# Patient Record
Sex: Male | Born: 1940 | Race: White | Hispanic: No | Marital: Married
Health system: Southern US, Community
[De-identification: ages and names within clinical notes are randomized; demographics above are authoritative.]

---

## 2008-10-24 ENCOUNTER — Inpatient Hospital Stay (HOSPITAL_COMMUNITY): Admission: EM | Admit: 2008-10-24 | Discharge: 2008-11-04 | Payer: Self-pay | Admitting: Emergency Medicine

## 2008-10-25 ENCOUNTER — Ambulatory Visit: Payer: Self-pay | Admitting: Critical Care Medicine

## 2008-10-30 ENCOUNTER — Ambulatory Visit: Payer: Self-pay | Admitting: Physical Medicine & Rehabilitation

## 2008-11-04 ENCOUNTER — Ambulatory Visit: Payer: Self-pay | Admitting: Physical Medicine & Rehabilitation

## 2008-11-04 ENCOUNTER — Inpatient Hospital Stay (HOSPITAL_COMMUNITY): Admission: RE | Admit: 2008-11-04 | Discharge: 2008-11-12 | Payer: Self-pay | Admitting: Neurosurgery

## 2009-02-04 ENCOUNTER — Encounter: Admission: RE | Admit: 2009-02-04 | Discharge: 2009-02-04 | Payer: Self-pay | Admitting: Orthopedic Surgery

## 2009-09-14 IMAGING — CR DG ABDOMEN 1V
2 series · 2 of 2 positions shown · non-contrast
Comparison: Plain films abdomen 11/01/2008 and 10/30/2008.

CLINICAL DATA: Distended abdomen.

ABDOMEN - 1 VIEW

[t abdomen supine (1 of 2)]
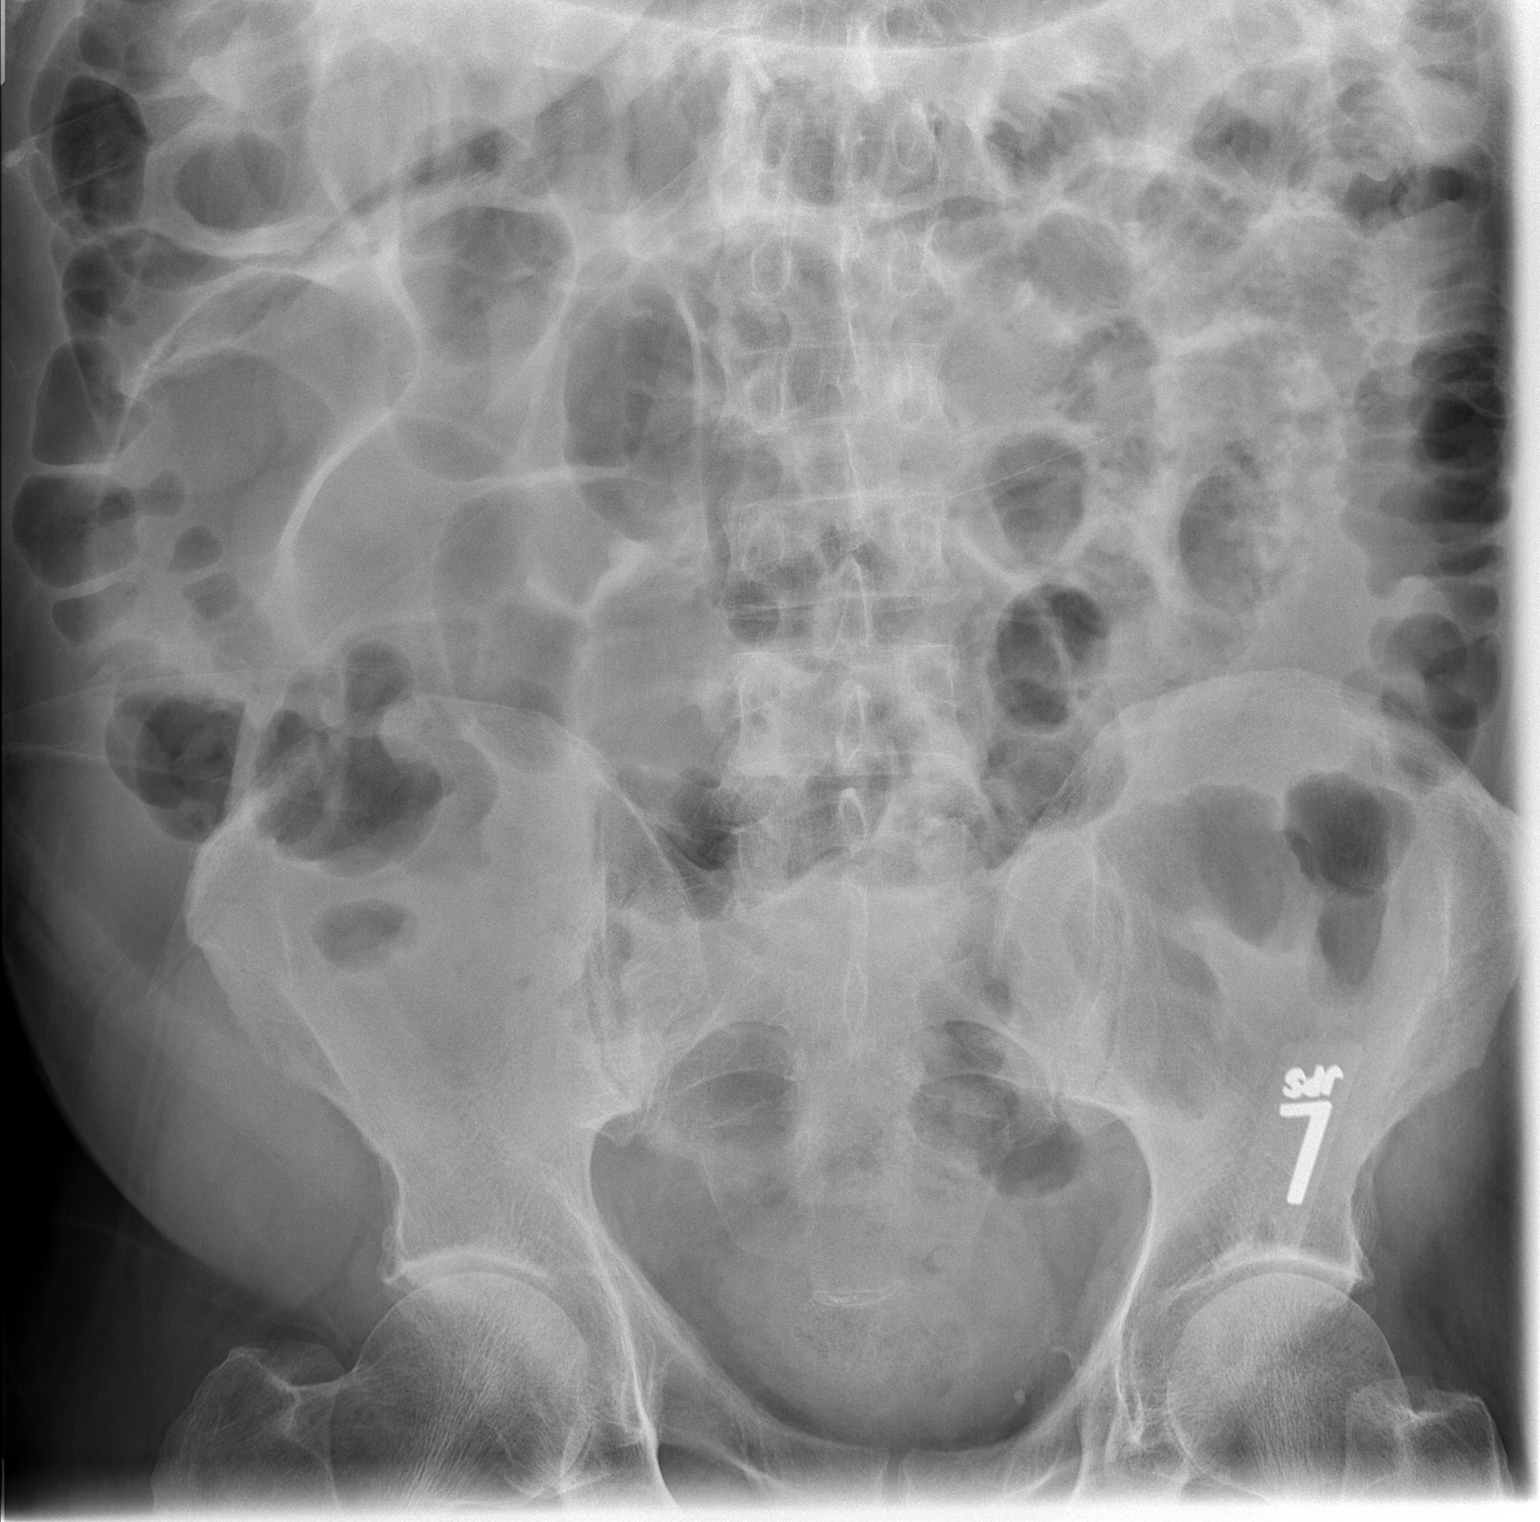

[t abdomen supine (2 of 2)]
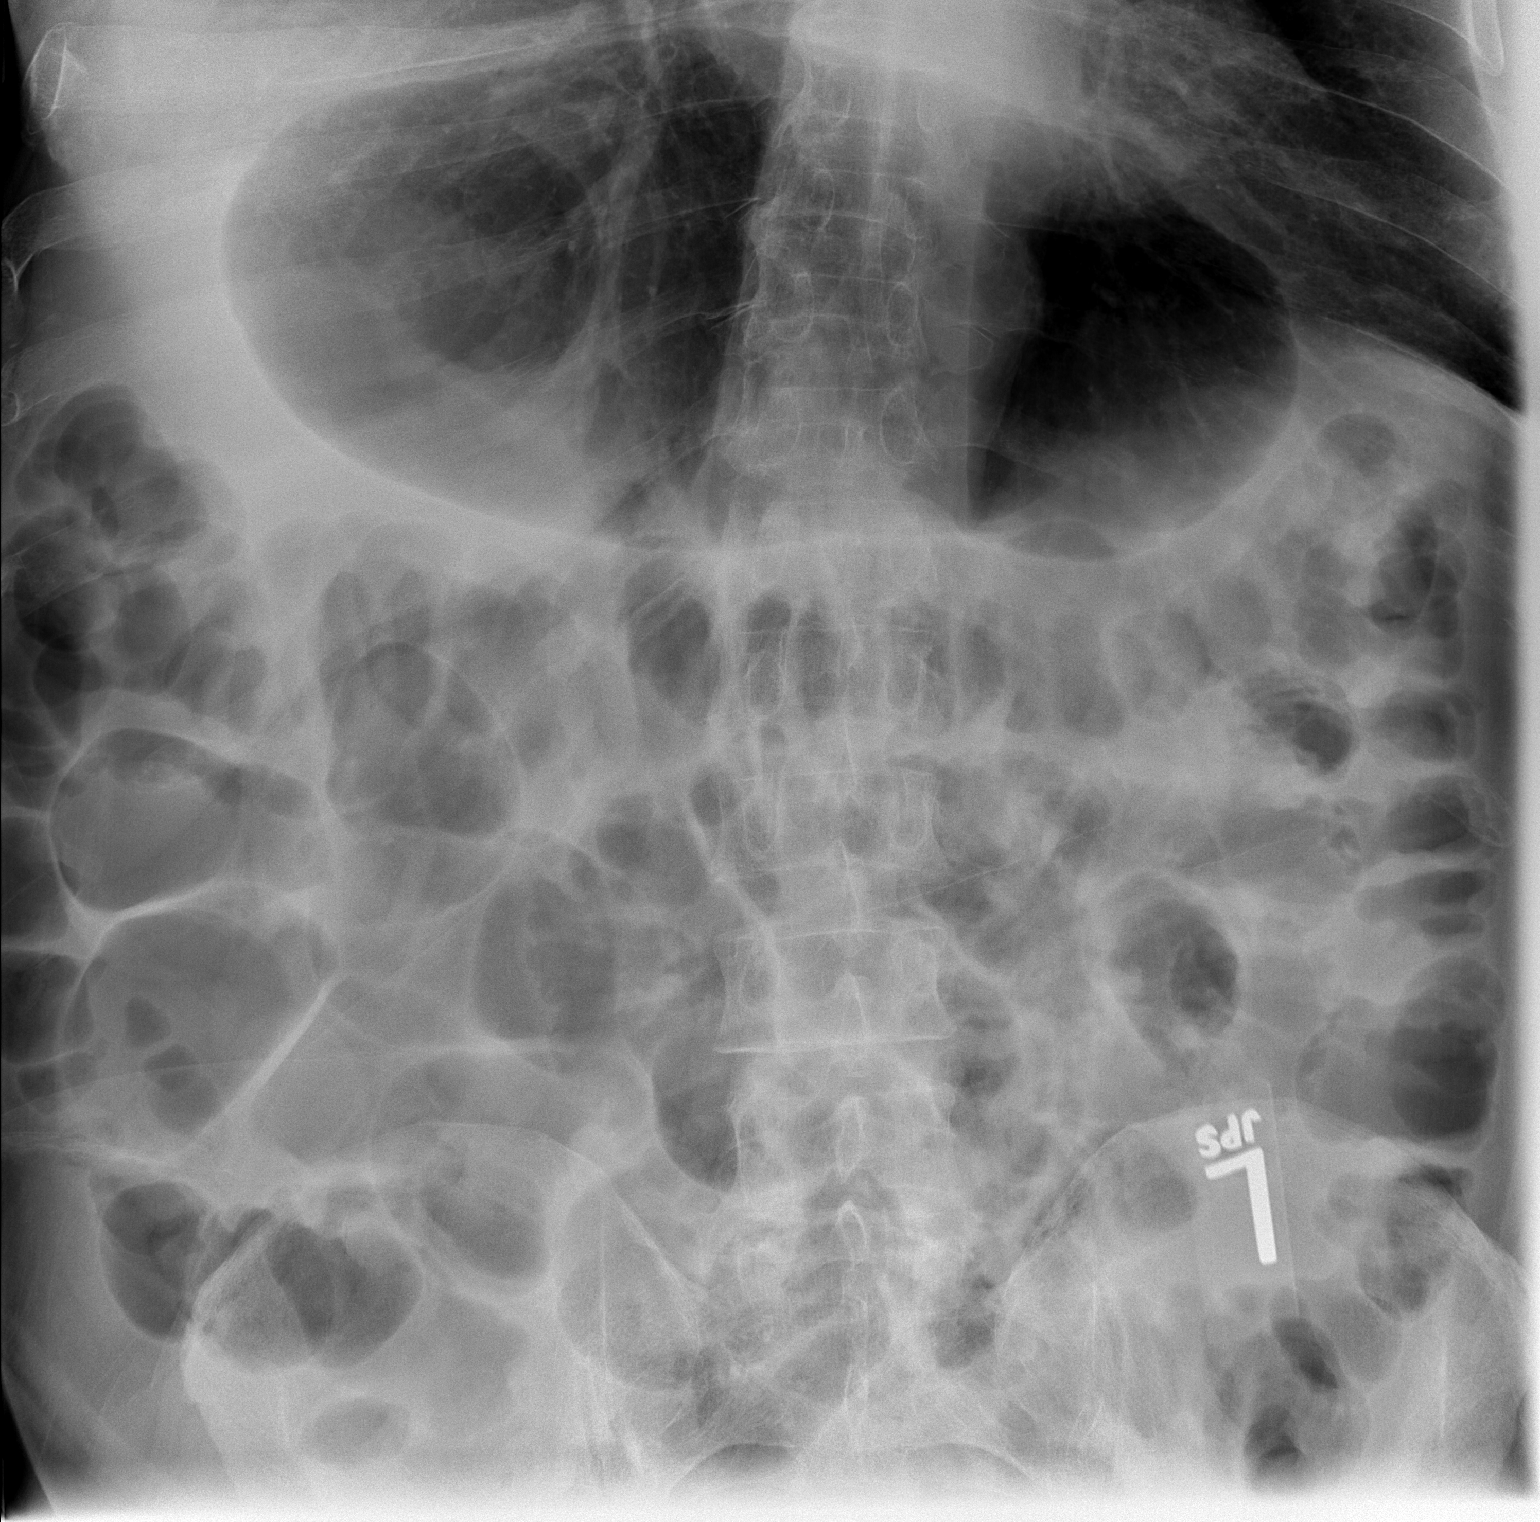

[2 of 2 positions shown; findings below may reference images not displayed]

FINDINGS: Gas filled loops of small and large bowel are seen
diffusely about the abdomen.  The stomach is distended with gas.
No evidence of free air is seen on single view.
IMPRESSION: Unchanged bowel gas pattern most compatible with ileus.

## 2010-08-23 LAB — BASIC METABOLIC PANEL
BUN: 11 mg/dL (ref 6–23)
BUN: 12 mg/dL (ref 6–23)
BUN: 16 mg/dL (ref 6–23)
BUN: 17 mg/dL (ref 6–23)
BUN: 18 mg/dL (ref 6–23)
CO2: 23 mEq/L (ref 19–32)
CO2: 24 mEq/L (ref 19–32)
CO2: 28 mEq/L (ref 19–32)
CO2: 34 mEq/L — ABNORMAL HIGH (ref 19–32)
CO2: 25 mEq/L (ref 19–32)
Calcium: 9.1 mg/dL (ref 8.4–10.5)
Chloride: 104 mEq/L (ref 96–112)
Chloride: 105 mEq/L (ref 96–112)
Chloride: 106 mEq/L (ref 96–112)
Chloride: 96 mEq/L (ref 96–112)
Chloride: 109 mEq/L (ref 96–112)
Creatinine, Ser: 1.03 mg/dL (ref 0.4–1.5)
GFR calc Af Amer: >60 mL/min (ref >60)
GFR calc Af Amer: >60 mL/min (ref >60)
GFR calc non Af Amer: >60 mL/min (ref >60)
GFR calc non Af Amer: >60 mL/min (ref >60)
GFR calc non Af Amer: >60 mL/min (ref >60)
Glucose, Bld: 105 mg/dL — ABNORMAL HIGH (ref 70–99)
Glucose, Bld: 109 mg/dL — ABNORMAL HIGH (ref 70–99)
Glucose, Bld: 114 mg/dL — ABNORMAL HIGH (ref 70–99)
Glucose, Bld: 114 mg/dL — ABNORMAL HIGH (ref 70–99)
Glucose, Bld: 95 mg/dL (ref 70–99)
Glucose, Bld: 144 mg/dL — ABNORMAL HIGH (ref 70–99)
Potassium: 4 mEq/L (ref 3.5–5.1)
Potassium: 4.1 mEq/L (ref 3.5–5.1)
Potassium: 4.1 mEq/L (ref 3.5–5.1)
Potassium: 4.2 mEq/L (ref 3.5–5.1)
Potassium: 4.3 mEq/L (ref 3.5–5.1)
Potassium: 3.7 mEq/L (ref 3.5–5.1)
Sodium: 137 mEq/L (ref 135–145)
Sodium: 144 mEq/L (ref 135–145)

## 2010-08-23 LAB — DIFFERENTIAL
Basophils Absolute: 0.1 10*3/uL (ref 0.0–0.1)
Basophils Absolute: 0 10*3/uL (ref 0.0–0.1)
Basophils Absolute: 0 10*3/uL (ref 0.0–0.1)
Basophils Absolute: 0.1 10*3/uL (ref 0.0–0.1)
Basophils Absolute: 0.1 10*3/uL (ref 0.0–0.1)
Basophils Relative: 1 % (ref 0–1)
Basophils Relative: 0 % (ref 0–1)
Basophils Relative: 0 % (ref 0–1)
Basophils Relative: 0 % (ref 0–1)
Basophils Relative: 1 % (ref 0–1)
Eosinophils Absolute: 0.4 10*3/uL (ref 0.0–0.7)
Eosinophils Absolute: 0 10*3/uL (ref 0.0–0.7)
Eosinophils Absolute: 0.2 10*3/uL (ref 0.0–0.7)
Eosinophils Absolute: 0.3 10*3/uL (ref 0.0–0.7)
Eosinophils Absolute: 0.4 10*3/uL (ref 0.0–0.7)
Eosinophils Relative: 4 % (ref 0–5)
Eosinophils Relative: 0 % (ref 0–5)
Eosinophils Relative: 2 % (ref 0–5)
Eosinophils Relative: 3 % (ref 0–5)
Eosinophils Relative: 4 % (ref 0–5)
Lymphocytes Relative: 25 % (ref 12–46)
Lymphocytes Relative: 13 % (ref 12–46)
Lymphocytes Relative: 19 % (ref 12–46)
Lymphocytes Relative: 26 % (ref 12–46)
Lymphocytes Relative: 7 % — ABNORMAL LOW (ref 12–46)
Lymphs Abs: 2.4 10*3/uL (ref 0.7–4.0)
Lymphs Abs: 0.7 10*3/uL (ref 0.7–4.0)
Lymphs Abs: 2 10*3/uL (ref 0.7–4.0)
Lymphs Abs: 2.2 10*3/uL (ref 0.7–4.0)
Lymphs Abs: 2.5 10*3/uL (ref 0.7–4.0)
Monocytes Absolute: 0.9 10*3/uL (ref 0.1–1.0)
Monocytes Absolute: 0.9 10*3/uL (ref 0.1–1.0)
Monocytes Absolute: 1 10*3/uL (ref 0.1–1.0)
Monocytes Absolute: 1.2 10*3/uL — ABNORMAL HIGH (ref 0.1–1.0)
Monocytes Absolute: 1.2 10*3/uL — ABNORMAL HIGH (ref 0.1–1.0)
Monocytes Relative: 9 % (ref 3–12)
Monocytes Relative: 10 % (ref 3–12)
Monocytes Relative: 12 % (ref 3–12)
Monocytes Relative: 6 % (ref 3–12)
Monocytes Relative: 9 % (ref 3–12)
Neutro Abs: 6.1 10*3/uL (ref 1.7–7.7)
Neutro Abs: 12.4 10*3/uL — ABNORMAL HIGH (ref 1.7–7.7)
Neutro Abs: 5.4 10*3/uL (ref 1.7–7.7)
Neutro Abs: 7.6 10*3/uL (ref 1.7–7.7)
Neutro Abs: 9.6 10*3/uL — ABNORMAL HIGH (ref 1.7–7.7)
Neutrophils Relative %: 62 % (ref 43–77)
Neutrophils Relative %: 57 % (ref 43–77)
Neutrophils Relative %: 67 % (ref 43–77)
Neutrophils Relative %: 80 % — ABNORMAL HIGH (ref 43–77)
Neutrophils Relative %: 85 % — ABNORMAL HIGH (ref 43–77)

## 2010-08-23 LAB — BASIC METABOLIC PANEL WITH GFR
BUN: 13 mg/dL (ref 6–23)
CO2: 31 meq/L (ref 19–32)
Calcium: 8.3 mg/dL — ABNORMAL LOW (ref 8.4–10.5)
Chloride: 99 meq/L (ref 96–112)
Creatinine, Ser: 1.1 mg/dL (ref 0.4–1.5)
GFR calc non Af Amer: >60 mL/min
Glucose, Bld: 102 mg/dL — ABNORMAL HIGH (ref 70–99)
Potassium: 3.8 meq/L (ref 3.5–5.1)
Sodium: 135 meq/L (ref 135–145)

## 2010-08-23 LAB — CARDIAC PANEL(CRET KIN+CKTOT+MB+TROPI)
CK, MB: 2.4 ng/mL (ref 0.3–4.0)
CK, MB: 2.7 ng/mL (ref 0.3–4.0)
Relative Index: 0.3 (ref 0.0–2.5)
Relative Index: 0.9 (ref 0.0–2.5)
Total CK: 274 U/L — ABNORMAL HIGH (ref 7–232)
Total CK: 831 U/L — ABNORMAL HIGH (ref 7–232)
Troponin I: 0.08 ng/mL — ABNORMAL HIGH (ref 0.00–0.06)

## 2010-08-23 LAB — URINALYSIS, MICROSCOPIC ONLY
Glucose, UA: NEGATIVE mg/dL
Protein, ur: 100 mg/dL — AB

## 2010-08-23 LAB — CBC
HCT: 34.1 % — ABNORMAL LOW (ref 39.0–52.0)
HCT: 34.5 % — ABNORMAL LOW (ref 39.0–52.0)
HCT: 35.5 % — ABNORMAL LOW (ref 39.0–52.0)
HCT: 36.3 % — ABNORMAL LOW (ref 39.0–52.0)
HCT: 36.4 % — ABNORMAL LOW (ref 39.0–52.0)
HCT: 37.6 % — ABNORMAL LOW (ref 39.0–52.0)
HCT: 37.8 % — ABNORMAL LOW (ref 39.0–52.0)
HCT: 35.7 % — ABNORMAL LOW (ref 39.0–52.0)
HCT: 36.1 % — ABNORMAL LOW (ref 39.0–52.0)
HCT: 37.1 % — ABNORMAL LOW (ref 39.0–52.0)
HCT: 43.5 % (ref 39.0–52.0)
Hemoglobin: 12 g/dL — ABNORMAL LOW (ref 13.0–17.0)
Hemoglobin: 12.4 g/dL — ABNORMAL LOW (ref 13.0–17.0)
Hemoglobin: 12.7 g/dL — ABNORMAL LOW (ref 13.0–17.0)
Hemoglobin: 12.8 g/dL — ABNORMAL LOW (ref 13.0–17.0)
Hemoglobin: 11.9 g/dL — ABNORMAL LOW (ref 13.0–17.0)
Hemoglobin: 12 g/dL — ABNORMAL LOW (ref 13.0–17.0)
Hemoglobin: 12.5 g/dL — ABNORMAL LOW (ref 13.0–17.0)
Hemoglobin: 14.7 g/dL (ref 13.0–17.0)
MCHC: 33.5 g/dL (ref 30.0–36.0)
MCHC: 34 g/dL (ref 30.0–36.0)
MCHC: 34 g/dL (ref 30.0–36.0)
MCHC: 33.6 g/dL (ref 30.0–36.0)
MCHC: 33.6 g/dL (ref 30.0–36.0)
MCHC: 33.7 g/dL (ref 30.0–36.0)
MCV: 92 fL (ref 78.0–100.0)
MCV: 92.2 fL (ref 78.0–100.0)
MCV: 92.3 fL (ref 78.0–100.0)
MCV: 92.4 fL (ref 78.0–100.0)
MCV: 93.4 fL (ref 78.0–100.0)
MCV: 94.1 fL (ref 78.0–100.0)
MCV: 91.6 fL (ref 78.0–100.0)
MCV: 93.2 fL (ref 78.0–100.0)
Platelets: 242 10*3/uL (ref 150–400)
Platelets: 271 10*3/uL (ref 150–400)
Platelets: 362 10*3/uL (ref 150–400)
Platelets: 399 10*3/uL (ref 150–400)
Platelets: 436 10*3/uL — ABNORMAL HIGH (ref 150–400)
Platelets: 199 10*3/uL (ref 150–400)
Platelets: 209 10*3/uL (ref 150–400)
RBC: 3.83 MIL/uL — ABNORMAL LOW (ref 4.22–5.81)
RBC: 3.94 MIL/uL — ABNORMAL LOW (ref 4.22–5.81)
RBC: 4.09 MIL/uL — ABNORMAL LOW (ref 4.22–5.81)
RBC: 3.83 MIL/uL — ABNORMAL LOW (ref 4.22–5.81)
RBC: 3.93 MIL/uL — ABNORMAL LOW (ref 4.22–5.81)
RBC: 4.05 MIL/uL — ABNORMAL LOW (ref 4.22–5.81)
RBC: 4.78 MIL/uL (ref 4.22–5.81)
RDW: 13.4 % (ref 11.5–15.5)
RDW: 13.6 % (ref 11.5–15.5)
RDW: 13.7 % (ref 11.5–15.5)
RDW: 13.7 % (ref 11.5–15.5)
RDW: 13.8 % (ref 11.5–15.5)
RDW: 14 % (ref 11.5–15.5)
RDW: 13.6 % (ref 11.5–15.5)
RDW: 14.3 % (ref 11.5–15.5)
WBC: 10.1 10*3/uL (ref 4.0–10.5)
WBC: 9.1 10*3/uL (ref 4.0–10.5)
WBC: 9.3 10*3/uL (ref 4.0–10.5)
WBC: 11.3 10*3/uL — ABNORMAL HIGH (ref 4.0–10.5)
WBC: 11.3 10*3/uL — ABNORMAL HIGH (ref 4.0–10.5)
WBC: 9.5 10*3/uL (ref 4.0–10.5)

## 2010-08-23 LAB — BLOOD GAS, ARTERIAL
Bicarbonate: 25.1 mEq/L — ABNORMAL HIGH (ref 20.0–24.0)
FIO2: 1 %
TCO2: 26.6 mmol/L (ref 0–100)
pH, Arterial: 7.286 — ABNORMAL LOW (ref 7.350–7.450)
pO2, Arterial: 99.1 mmHg (ref 80.0–100.0)

## 2010-08-23 LAB — PROTIME-INR
INR: 1.6 — ABNORMAL HIGH (ref 0.00–1.49)
INR: 1.6 — ABNORMAL HIGH (ref 0.00–1.49)
INR: 2 — ABNORMAL HIGH (ref 0.00–1.49)
INR: 2.1 — ABNORMAL HIGH (ref 0.00–1.49)
INR: 2.3 — ABNORMAL HIGH (ref 0.00–1.49)
INR: 2.7 — ABNORMAL HIGH (ref 0.00–1.49)
INR: 3 — ABNORMAL HIGH (ref 0.00–1.49)
INR: 3 — ABNORMAL HIGH (ref 0.00–1.49)
INR: 3.2 — ABNORMAL HIGH (ref 0.00–1.49)
INR: 3.4 — ABNORMAL HIGH (ref 0.00–1.49)
Prothrombin Time: 19.5 s — ABNORMAL HIGH (ref 11.6–15.2)
Prothrombin Time: 20.2 s — ABNORMAL HIGH (ref 11.6–15.2)
Prothrombin Time: 24.1 s — ABNORMAL HIGH (ref 11.6–15.2)
Prothrombin Time: 24.4 s — ABNORMAL HIGH (ref 11.6–15.2)
Prothrombin Time: 27.1 s — ABNORMAL HIGH (ref 11.6–15.2)
Prothrombin Time: 30.5 seconds — ABNORMAL HIGH (ref 11.6–15.2)
Prothrombin Time: 33.2 seconds — ABNORMAL HIGH (ref 11.6–15.2)
Prothrombin Time: 34.4 seconds — ABNORMAL HIGH (ref 11.6–15.2)
Prothrombin Time: 35.5 seconds — ABNORMAL HIGH (ref 11.6–15.2)
Prothrombin Time: 36.9 s — ABNORMAL HIGH (ref 11.6–15.2)

## 2010-08-23 LAB — CULTURE, BLOOD (ROUTINE X 2)
Culture: NO GROWTH
Culture: NO GROWTH

## 2010-08-23 LAB — COMPREHENSIVE METABOLIC PANEL
AST: 23 U/L (ref 0–37)
Albumin: 3.2 g/dL — ABNORMAL LOW (ref 3.5–5.2)
BUN: 16 mg/dL (ref 6–23)
CO2: 23 mEq/L (ref 19–32)
Calcium: 8.7 mg/dL (ref 8.4–10.5)
Calcium: 9.3 mg/dL (ref 8.4–10.5)
Creatinine, Ser: 1.44 mg/dL (ref 0.4–1.5)
GFR calc Af Amer: 59 mL/min — ABNORMAL LOW (ref >60)
GFR calc non Af Amer: >60 mL/min (ref >60)
GFR calc non Af Amer: 49 mL/min — ABNORMAL LOW (ref >60)
Glucose, Bld: 114 mg/dL — ABNORMAL HIGH (ref 70–99)
Total Protein: 6.6 g/dL (ref 6.0–8.3)
Total Protein: 6 g/dL (ref 6.0–8.3)

## 2010-08-23 LAB — MAGNESIUM: Magnesium: 2.2 mg/dL (ref 1.5–2.5)

## 2010-08-23 LAB — ABO/RH: ABO/RH(D): B POS

## 2010-08-23 LAB — LIPASE, BLOOD: Lipase: 57 U/L (ref 11–59)

## 2010-08-23 LAB — TYPE AND SCREEN
ABO/RH(D): B POS
Antibody Screen: NEGATIVE

## 2010-08-23 LAB — LACTIC ACID, PLASMA: Lactic Acid, Venous: 1.7 mmol/L (ref 0.5–2.2)

## 2010-08-23 LAB — URINE CULTURE: Colony Count: NO GROWTH

## 2010-09-28 NOTE — Op Note (Signed)
NAME:  Jeff Morrow, Jeff Morrow NO.:  [id number]   MEDICAL RECORD NO.:  [id number]          PATIENT TYPE:  INP   LOCATION:  2310                         FACILITY:  [facility]   PHYSICIAN:  Doralee Albino. Carola Frost, M.D. DATE OF BIRTH:  [birth year]   DATE OF PROCEDURE:  10/28/2008  DATE OF DISCHARGE:                               OPERATIVE REPORT   PREOPERATIVE DIAGNOSIS:  Right displaced pilon fracture, tibia and  fibula.   POSTOPERATIVE DIAGNOSIS:  Right displaced pilon fracture, tibia and  fibula.   PROCEDURES:  1. Open reduction and internal fixation of right tibia pilon fracture,      tibia and fibula.  2. Application of external fixator.   SURGEON:  Doralee Albino. Carola Frost, MD   ASSISTANT:  Mearl Latin, PA   ANESTHESIA:  General.   COMPLICATIONS:  None.   ESTIMATED BLOOD LOSS:  About 70 mL.   DISPOSITION:  PACU.   CONDITION:  Stable.   BRIEF SUMMARY AND INDICATIONS FOR PROCEDURE:  Jaiceon Collister is a 70-year-  old male who fell from a 5-feet height off a ladder sustaining a  comminuted pilon fracture, seen initially by Dr. Ollen Gross.  Given  the complexity of the fracture pattern, he requested my assistance for  evaluation and management since I think that was outside the scope of  practice.  I discussed with Mr. Lopezmartinez and his son the risks and benefits  of surgery including the possibility of infection, nerve injury, vessel  injury, need of further surgery, DVT, PE, arthritis, and multiple  others.  After full discussion, he wished to proceed.   DESCRIPTION OF PROCEDURE:  Mr. Sandler was taken to the operating room  where general anesthesia was induced.  His right lower extremity was  prepped and draped in the usual sterile fashion.  His soft tissues were  felt to be too tenuous for a formal open reduction and internal fixation  of his entire fracture, and consequently, I proceeded with a limited  approach.  I first applied 2 ex-fix pins to the tibial shaft and a  transcalcaneal pin applying distraction reducing the shortening by  restoring appropriate length and rotation.  This was checked on AP and  lateral images.  The distal tibia and fibula remained laterally  translated and significant impaction of the articular surfaces to the  tibia remained as well.  At that point, I then identified the  appropriate starting point for intramedullary fixation of the distal  fibula on both the AP and lateral planes and drilled a 3.5 hole on the  tip of the fibula in its trajectory and advanced the guide pin across.  I did contour the rod somewhat further to facilitate this, and at the  same time, I released the rotational control of the external fixator and  allowed the distal segments to shift back medially into reduced  alignment.  The pin that was driven across did greatly assist with this.  It was cut off the appropriate length and then capped in.  I then turned  attention back to the anteromedial tibia making a longitudinal  incision  and carefully going underneath the anterior tib tendon into the fracture  site and directly in stepwise fashion, reducing the articular segments  starting with the anterolateral segment, then the posterolateral and  central posterior segments using the Cobb and bone tamp in sequence.  I  then placed a lag screw from medial to lateral and then also  anterolateral to posterolateral confirming appropriate trajectory on  both the AP and lateral views.  I had all these incisions irrigated  thoroughly and C-arm images obtained showing appropriate reduction,  hardware placement, and alignment.  I then used fluoro to change planes  with the fixture extended along the foot placing one pin into the first  and one into the fifth metatarsals.  I then securing this new reduced  position and making sure that our previous work had held.  Wounds were  irrigated thoroughly, closed in standard layered fashion with 3-0 Vicryl  and 3-0 nylon  and then a sterile gently compressive dressing.  The  patient was awakened from anesthesia and transported to the PACU in  stable condition.  Montez Morita, PAc, assisted throughout the procedure  and was necessary to maintain reduction during instrumentation and to  convert provisional into definitive fixation while I then held the  reduction.   PROGNOSIS:  If Mr. Mapps can maintain compliance, he should do very well  given the restoration of alignment and articular congruity through  minimal approaches.  He will be on a DVT prophylaxis with Lovenox and  then most likely Coumadin as well.  I would anticipate delayed recovery  for him with his weight increasing his risk of complications.  We will  plan to see him back in the office in 10 days for evaluation of his  wounds with probably removal of the sutures around the 2- to 3-week mark  given his propensity toward lower extremity edema.      Doralee Albino. Carola Frost, M.D.  Electronically Signed     MHH/MEDQ  D:  10/28/2008  T:  10/29/2008  Job:  [id number]

## 2010-09-28 NOTE — Consult Note (Signed)
NAME:  Jeff Morrow, Jeff Morrow NO.:  [id number]   MEDICAL RECORD NO.:  [id number]          PATIENT TYPE:  INP   LOCATION:  2009                         FACILITY:  [facility]   PHYSICIAN:  Doralee Albino. Carola Frost, M.D. DATE OF BIRTH:  09-12-1940   DATE OF CONSULTATION:  10/27/2008  DATE OF DISCHARGE:                                 CONSULTATION   REQUESTING PHYSICIAN:  Ollen Gross, MD, Orthopedics.   REASON FOR CONSULTATION:  Right pilon fracture.   HISTORY OF PRESENT ILLNESS:  Jeff Morrow is a 70 year old Caucasian male  with a past medical history significant for hypertension and GERD who  fell off the ladder on October 24, 2008, and sustained a severe fracture to  his right distal tibia and fibula.  The patient states that he was  helping his son with some housework when he fell approximately 6 feet  off the ladder to the ground below and sustained the injury to his right  leg.  The patient had immediate onset of pain and inability to bear  weight.  As such, he was brought to [facility] for evaluation.  Evaluation was conducted by Dr. Ollen Gross, Orthopedics, which  demonstrated a severely comminuted fracture of the right tibial plafond  as well as the right distal fibula.  Dr. Lequita Halt did place Jeff Morrow in  a well-molded posterior and stirrup splint and given the severity of the  injury and complexity, consult to Dr. Carola Frost in the Orthopedic Trauma  Service for consultation and definitive management.   Jeff Morrow was admitted to the orthopedic floor for observation and pain  control from the ED; however, he developed some acute respiratory  failure and was transported to the intensive care unit for continued  observation and management and possibly intubation and his respiratory  status decreased much more.  Currently on my evaluation, Jeff Morrow is in  the ICU 2300.  He is not intubated at this time.  He is awake and alert,  a very pleasant gentleman.  He reports that  he is having some pain in  his right leg, but it is very tolerable.  He does continue to have some  difficulty with breathing and does become short of breath when talking.  Of note, Critical Care Medicine and Internal Medicine were following  along at this point in time as well.  Jeff Morrow denied any numbness or  tingling in his right lower extremity as well.  No additional injuries  were noted with the exception of left knee contusion.  Jeff Morrow does  not report any additional issues at this time.   PAST MEDICAL HISTORY:  Significant for:  1. Hypertension.  2. GERD.  3. Obesity.  4. Past history of nephrolithiasis.   PAST SURGICAL HISTORY:  None.   ALLERGIES:  CODEINE.   SOCIAL HISTORY:  The patient is a retired [employer].  He is married and has several children who all of them are grown.  The  patient denies tobacco use and denies alcohol use as well.  The patient  does live with  his wife.   MEDICATIONS:  Reviewed and are included in the chart, but do include  Lovenox, nifedipine, pantoprazole, Xanax, Benadryl, Robaxin, Zofran, and  morphine is also listed as well.   PHYSICAL EXAMINATION:  VITAL SIGNS:  Temperature 98.1, heart rate 80,  respirations 16 at 96% on 4 L, and BP 126/66.  GENERAL:  The patient is awake and alert to person, place, and time.  He  does have O2 via nasal cannula, on at this time.  HEENT:  Atraumatic.  Extraocular muscles are intact.  Moist mucous  membranes noted.  LUNGS:  Anterior fields are clear, decreased at the bases.  No rhonchi  sounds noted.  CARDIAC:  S1 and S2.  ABDOMEN:  Slightly distended but nontender.  Positive bowel sounds are  noted.  PELVIS:  No pain with compression at the ASIS or iliac crest.  EXTREMITIES:  Bilateral upper extremity are free of gross deformities.  No crepitus or block motion at the hands, wrist, elbows, and shoulders  bilaterally.  No tenderness to palpation of the soft tissue or bony   landmarks of the upper extremities bilaterally.  No gross deformities.  Radial, ulnar, median, axillary nerve sensory functions are intact  bilaterally.  Radial, ulnar, median, axillary, anterior interosseous and  posterior interosseous nerve motor function are intact bilaterally as  well.  Palpable radial pulse appreciated.  Extremities are warm with  brisk capillary refill and color is appropriate as well.  The patient is  able to demonstrate full range of motion in his upper extremities as  well.  Evaluation of the left lower extremity, no gross deformities of  the hip or ankle.  There is ecchymosis and tenderness about the left  knee with mild knee effusion appreciated.  The patient is able to  perform a straight leg raise and demonstrate quad and hamstring function  actively.  No pain with axial loading of the hip or log rolling of the  hip.  No knee instability is appreciated on the left side as well.  No  ankle instability is appreciated.  Deep peroneal nerve, superficial  peroneal nerve, tibial nerve, femoral nerve sensory functions are  intact.  EHL, FHL, anterior tibialis, posterior tibialis, peroneals,  gastroc-soleus complex and motor function are also intact as well.  No  deep calf tenderness is noted.  Compartments are soft and nontender.  Palpable dorsalis pedis pulse is appreciated.  Right lower extremity,  there is a posterior and stirrup splint in place supporting the right  pilon fracture.  The Ace bandage was parted and the cast padding was  parted as well to evaluate the anterior soft tissue, which did show  significant swelling as well.  Minimal wrinkling was appreciated with  evaluation of the skin.  No lesions or blisters were noted with the skin  that was visualized.  Deep peroneal nerve, superficial peroneal nerve,  tibial nerve, and femoral nerve sensory functions were intact.  EHL,  FHL, and motor function is intact.  The patient does demonstrate active  quad  contraction on the right leg as well.  No pain in the left hip is  noted.  No pain with actual loading of the left hip or log rolling.  No  pain with palpation of the knee.   LABORATORY STUDIES:  White blood cell is 11.3, hemoglobin 11.9, and  hematocrit 36.1.  Cardiac panel did demonstrate a spike in his troponin  I 0.08 and elevated creatinine kinase, but remainder of the markers were  elevated.  Complete metabolic panel on admission, sodium 140, potassium  4.1, chloride 107, bicarb 27, glucose 151, BUN 22, creatinine 1.44, and  calcium 9.3.  X-rays, 2-view right tibia demonstrate a severely  comminuted distal tibia fracture with extension into the tibial plafond.  There is a significant valgus angulation with severe metaphyseal  comminution as well as impaction.  CT scan of the right distal tibia  also demonstrate severely comminuted right tibial plafond fracture and  distal fibula fracture.  There is significant impaction of mid tibia,  which is visualized on coronal and sagittal views.  The talus and tarsal  bones are intact.  X-ray of the left knee, overall alignment appears  well with some joint space narrowing and osteoarthritis, question of a  lucency at the superior pole of the patella with a fracture in a  decubitus manner exhibiting lateral aspect of the patella.  This may be  simply a shadow, but I will correlate with the clinical findings.  It  does not appear that there is any displacement of the patella if  fracture exist, soft tissue shadows of the patella tendon and quadriceps  tendon are intact as well.   ASSESSMENT AND PLAN:  A 70 year old male, status post fall off ladder  with right pilon fracture tibial-fibula and change in mental  status/respiratory depression requiring transfer to ICU.  1. Right pilon fracture with significant comminution and valgus      angulation.  Orthopaedic Trauma Association Classification 43-C3.      a.     Significant intraarticular  metaphyseal injury present to the       right tibial plafond and right distal fibula.      b.     The patient will require surgical intervention to restore       alignment, stability and joint congruity.      c.     Did expose some soft tissue without removing splint and it       does appear that there is still moderate swelling.  We will       further evaluate in the OR for fracture blisters and other skin       issues to determine the best course of treatment.      d.     Suspect soft tissue may have too much acute injury to allow       formal open reduction and internal fixation at this time.       Therefore, likely that we will place the patient in an external       fixator and await resolution of soft tissue injury.  Application       of the ex-fix may be accompanied by fixation of tibia with a       titanium nail plate or achieving length solely with ex-fix.      e.     Plan for possible OR tomorrow.  We will discuss with other       consultants as there may be some concerns still due to respiratory       issues.  2. Left knee contusion versus nondisplaced patellar fracture.      Question subtle fracture of left patella, positive ecchymosis,      positive effusion, tender to palpation, but the patient able to      perform straight leg raise.  Continue to monitor and may need      followup x-rays once the patient is mobilizing.  3. Multiple contusions and  abrasions stable.  4. Respiratory depression and change in mental status.  Continue      Critical Care Medicine and Internal Medicine.  Limit narcotics and      encourage incentive spirometer.  5. Episodic fever.  Currently on Zosyn, and multiple cultures have      been obtained.  6. Gastroesophageal reflux disease and hypertension.  Continue meds      and continue above medicine.  7. Fluids, electrolytes, nutrition.  N.p.o. after midnight.  Increase      IV fluids to 70 mL/hour at midnight.  8. Deep vein thrombosis prophylaxis.   Continue with Lovenox, foot      pumps, SCDs, left leg.  We will hold Lovenox tomorrow.  9. Disposition.  Possible OR tomorrow.  We will discuss with medicine      and critical care medicine if the patient is stable for the OR at      this time.  We will assume ortho care tomorrow after surgery.      Mearl Latin, PA      Doralee Albino. Carola Frost, M.D.  Electronically Signed    KWP/MEDQ  D:  10/31/2008  T:  11/01/2008  Job:  [id number]   cc:   Ollen Gross, M.D.

## 2010-09-28 NOTE — H&P (Signed)
NAME:  Jeff Morrow, Jeff Morrow NO.:  [id number]   MEDICAL RECORD NO.:  [id number]          PATIENT TYPE:  INP   LOCATION:                               FACILITY:  [facility]   PHYSICIAN:  Ranelle Oyster, M.D.DATE OF BIRTH:  August 21, 1940   DATE OF ADMISSION:  11/04/2008  DATE OF DISCHARGE:                              HISTORY & PHYSICAL   CHIEF COMPLAINT:  Right leg pain.   HISTORY OF PRESENT ILLNESS:  This is a pleasant 70 year old white male  admitted on October 24, 2008, after a fall 6-feet from a ladder landing on  his right side.  He sustained a right displaced pilon fracture of the  tibia and fibula.  He was placed in a cast until decreased swelling and  placed on medication for pain control.  The patient became somnolent on  medication.  Rapid Response was called and he improved with Narcan.  The  patient ultimately underwent an ORIF with external fixator on October 28, 2008, by Dr. Carola Frost.  He is nonweightbearing on the right lower  extremity.  He is on Coumadin for DVT prophylaxis.  KUB notable for mild  ileus and placed on senna and MiraLax.  The patient has gradually  improved, however.  Procardia was added for blood pressure control.  The  patient was seen initially by rehab on October 30, 2008,  and we have  followed along.  It was felt that he could benefit from rehab and  ultimately was brought today.   REVIEW OF SYSTEMS:  Notable for reflux and some nausea.  Appetite is  picking up.  Denies pain at this point in the right leg except when he  moves it.  Other pertinent positives are above and full 14-point review  is in the written H and P.   Past medical history is positive for:  1. Hypertension.  2. Reflux disease.  3. Kidney stones.  4. BPH.  5. Denies tobacco or alcohol use.   Family history is positive for CAD.   SOCIAL HISTORY:  The patient lives with his wife in [place].  He is  retired.  Wife can assist but is limited in lifting.  Son in  [place],  works.  He has 2-level house, bedroom downstairs, and 2 steps to enter.   ALLERGIES:  CODEINE.   HOME MEDICATIONS:  Flomax, Prilosec.   LABORATORY DATA:  Hemoglobin 12.7, white count 9.1, platelets 436.  Sodium 139, potassium 4.1, BUN 17, creatinine 1.07.   PHYSICAL EXAMINATION:  VITAL SIGNS:  Blood pressure is 117/62, pulse is  80, respiratory rate is 18, temperature 98.0.  GENERAL:  The patient is pleasant, alert and oriented x3.  HEENT:  Pupils equally round and reactive to light.  Ear, nose, and  throat exam is unremarkable with pink moist mucosa, and dentition is  fair.  NECK:  Supple without JVD or lymphadenopathy.  CHEST:  Clear to auscultation bilaterally without wheezes, rales, or  rhonchi.  HEART:  Regular rate and rhythm without murmurs, rubs, or gallop.  ABDOMEN:  Slightly distended but bowel sounds were positive  and abdomen  was nontender.  SKIN:  Notable for pin sites in the right lower extremity, which were  intact and dressed.  No drainage was seen.  No redness or breakdown was  appreciated.  Right lower extremity was slightly swollen around the  fracture site and fixator site.  NEUROLOGICAL:  Cranial nerves II through XII are intact.  Reflexes are  1+ and sensation is grossly normal throughout.  Strength is 5/5 in the  upper extremities.  Left lower extremity is 4/5 proximal to 5/5  distally.  The right lower extremity was able to lift slightly up the  bed and we did not move the knee or ankle on exam today.  Judgment,  orientation, memory, and mood are all functional.   POST-ADMISSION PHYSICIAN EVALUATION:  1. Functional deficits secondary to displaced right pilon fracture of      the tibia and fibula status post ORIF and external fixator on      postoperative day #7.  2. The patient is admitted to receive collaborative interdisciplinary      care between the physiatrist, rehab nursing staff, and therapy      team.  3. The patient's level of  medical complexity and substantial therapy      need in context of that medical necessity cannot be provided at a      lesser intensity of care.  4. The patient has experienced substantial functional loss from his      baseline.  Upon functional assessment at the time of preadmission      screening, the patient was total assistance.  In the last 24 hours,      he has total assist for gait 80 feet x2 using rolling walker; total      assist for transfers, supervision bed mobility, sit up, upper body      bathing, dressing; total assist for lower body bathing and      dressing.  Based on the patient's diagnoses, physical exam, and      functional history, he has displayed the ability to make functional      progress, which will result in measurable gains while in inpatient      rehab.  These gains will be of substantial and practical use upon      discharge to home.  Interim changes since our rehab consult are      detailed above.  5. Physiatrist will provide 24-hour management of medical needs as      well as oversight of the therapy plans/treatment and provide      guidance as appropriate regarding interaction of the two.  Medical      problem list and plan are listed below.  6. 24-hour rehab nursing will assist in the management of the      patient's wound care issues as well as pain management, bowel and      bladder function, medication administration, integration of therapy      concepts and techniques.  7. PT will assess and treat for lower extremity strength, functional      mobility, adaptive equipment, gait and balance with goals      ultimately is supervision to modified independent, primarily at the      wheelchair level.  We will have some short distance ambulation      goals.  8. OT will assess and treat for upper extremity use, ADLs, adaptive      techniques, equipment safety, family education with goals modified  independent to occasional min assist.  9. Case  management and social worker will assess and treat for      psychosocial issues and discharge planning.  10.Team conferences will be held weekly to assess progress towards      goals and to determine barriers at discharge.  11.The patient has demonstrated sufficient medical stability and      exercise capacity to tolerate at least 3 hours of therapy per day      at least 5 days per week.  12.Estimated length of stay is 2 weeks.  Prognosis is good.   MEDICAL PROBLEM LIST AND PLAN:  1. Pain control:  The patient denies significant pain at this point,      although this may change as his activity increases.  Continue      Vicodin, Robaxin for now.  2. Deep venous thrombosis prophylaxis with Coumadin.  Continue INR      followup per Pharmacy.  We will follow him closely for      complications related to bleeding.  3. Postoperative ileus.  The patient is on regular diet now and seems      to be moving his bowels.  He is still distended somewhat on      examination, though this will better if continue to watching      especially with immobility      and narcotic medications.  4. Blood pressure control.  Procardia and Lasix with fair results      currently.  Follow on a serial basis.  5. Benign prostatic hyperplasia.  Flomax.  Check PVRs and voiding      patterns.      Ranelle Oyster, M.D.  Electronically Signed     Ranelle Oyster, M.D.  Electronically Signed    ZTS/MEDQ  D:  11/04/2008  T:  11/05/2008  Job:  [id number]

## 2010-09-28 NOTE — Discharge Summary (Signed)
NAME:  Jeff Morrow, HAUBNER NO.:  [id number]   MEDICAL RECORD NO.:  [id number]          PATIENT TYPE:  IPS   LOCATION:  4039                         FACILITY:  [facility]   PHYSICIAN:  Ranelle Oyster, M.D.DATE OF BIRTH:  1940-11-20   DATE OF ADMISSION:  11/04/2008  DATE OF DISCHARGE:                               DISCHARGE SUMMARY   DISCHARGE DIAGNOSES:  1. Right displaced pilon fracture of tibia-fibula status post open      reduction internal fixation and external fixator June 15.  2. Pain management.  3. Coumadin for deep vein thrombosis prophylaxis.  4. Postoperative ileus resolved.  5. Hypertension.  6. Benign prostatic hypertrophy.  7. Gastroesophageal reflux disease.   This is a 70 year old white male admitted June 11 after a fall 6 feet  from a ladder landing on his right side.  There was no loss of  consciousness.  He sustained a right displaced pilon fracture of the  tibia and fibula.  Placed in a cast until decreased swelling.  Placed on  intravenous Dilaudid and PCA for pain control.  Noted increased  somnolence and lips became cyanotic.  Rapid response call.  Received  Narcan with good improvement.  Underwent open reduction internal  fixation with external fixator June 15 per Dr. handy.  Nonweightbearing  right lower extremity.  Coumadin for deep vein thrombosis prophylaxis.  Mild nausea.  Abdominal films June 17 showed mild ileus, placed on a  bowel regimen, follow-up films with slow improvement.  Diet steadily  advanced.  He was total assist for ambulation 8 feet x2.  He was  admitted for comprehensive rehab program.   PAST MEDICAL HISTORY:  See discharge diagnoses.  No alcohol or tobacco.   ALLERGIES:  CODEINE.   SOCIAL HISTORY:  Lives with his wife in [place].  He is retired.  Wife with limited lifting.  He has a son in [place] that works.  Two-  level home, bedroom downstairs, two steps to entry.   FUNCTIONAL HISTORY:  Independent prior  to admission.   FUNCTIONAL STATUS:  Upon admission to rehab services with total assist  ambulation 8 feet x2 with a rolling walker, total assist transfers,  supervision bed mobility, total assist lower body bathing and dressing.   MEDICATIONS PRIOR TO ADMISSION:  1. Flomax 0.4 mg at bedtime.  2. Prilosec daily.   PHYSICAL EXAMINATION:  Blood pressure 117/62, pulse 80, temperature 98,  respirations 18.  This was an alert male in no acute distress, oriented  x3.  Deep tendon reflexes 2+.  Calves remained cool without any swelling,  erythema, nontender.  External fixator right lower extremity with  sutures intact.  LUNGS:  Clear to auscultation.  CARDIAC:  Regular rate rhythm.  ABDOMEN:  Soft, nontender.  Good bowel sounds.   REHABILITATION HOSPITAL COURSE:  The patient was admitted to inpatient  rehab services with therapies initiated on a 3-hour daily basis  consisting of physical therapy, occupational therapy and rehabilitation  nursing.  The following issues were addressed during the patient's  rehabilitation stay.  Pertaining to Mr. Stthomas right displaced pilon  fracture of the tibia and fibula, he had undergone open reduction  internal fixation with external fixator June 15 per Dr. Carola Frost.  Surgical  site healing nicely.  No signs of infection.  He was nonweightbearing to  the right lower extremity.  Pain management ongoing with the use of  Vicodin and Robaxin with good results.  He remained on Coumadin for deep  vein thrombosis prophylaxis with INR of 2.7 on November 11, 2008.  Plan  would be to remain on Coumadin until July 15.  Home health nurse per  [facility] had been arranged with next pro time on  November 14, 2008 with results to Dr. Boyd Kerbs at [phone] who was  contacted prior to the patient's departure from the hospital.  The  patient's blood pressure remained well-controlled on Procardia and Lasix  with no orthostatic changes.  He did have a history  of benign prostatic  hypertrophy as he remained on Flomax.  The patient received weekly  collaborative interdisciplinary team conferences to discuss estimated  length of stay, family teaching, and any barriers to discharge.  He was  min guard for sit to stand, nonweightbearing to the right lower  extremity, ambulating minimal assist 30 feet x2 with a rolling walker  and close supervision, bed mobility was modified independence.  He was  needing some assistance for lower body activities of daily living.   Latest labs showed a sodium 137, potassium 3.9, BUN 16, creatinine 1.13,  hemoglobin 12.7, hematocrit 37.8, platelet 436,000, WBC of 9.1.  Latest  INR of 2.7.   DISCHARGE MEDICATIONS:  At time of dictation include:  1. Coumadin with latest dose of 2 mg adjusted accordingly for a goal      INR of 2.0-3.0.  2. Procardia XL 90 mg daily.  3. Prilosec daily.  4. Flomax 0.4 mg at bedtime.  5. Lasix 20 mg daily.  6. Senokot tablets 2 at bedtime twice daily.  7. Robaxin 500 mg every 6 hours as needed spasms.  8. Vicodin 5/325 one or two tablets every 4 hours as needed pain.  9. MiraLax 17 g in 8 ounces of water daily, hold for loose stools.   DIET:  Was regular.   WOUND CARE:  Was cleanse pin sites to external fixator daily with soap  and water.   Nonweightbearing right lower extremity.  Home health nurse to check INR  on Friday July 2 - results to Dr. Boyd Kerbs [phone], fax number  [phone].  The patient would remain on Coumadin until November 27, 2008,  and then stop for deep vein thrombosis prophylaxis.  The patient should  follow up Dr. Carola Frost of orthopedic services - call for appointment.      Mariam Dollar, P.A.      Ranelle Oyster, M.D.  Electronically Signed    DA/MEDQ  D:  11/11/2008  T:  11/11/2008  Job:  [id number]   cc:   Ranelle Oyster, M.D.  Doralee Albino. Carola Frost, M.D.  Laurena Slimmer, M.D.

## 2010-09-28 NOTE — Consult Note (Signed)
NAME:  Jeff Morrow, Jeff Morrow NO.:  [id number]   MEDICAL RECORD NO.:  [id number]          PATIENT TYPE:  INP   LOCATION:  2310                         FACILITY:  [facility]   PHYSICIAN:  Acey Lav, MD  DATE OF BIRTH:  Oct 24, 1940   DATE OF CONSULTATION:  10/25/2008  DATE OF DISCHARGE:                                 CONSULTATION   REQUESTING PHYSICIAN:  Dr. Lequita Halt.   This is an internal medicine consultation for a kidney stone.   HISTORY OF PRESENT ILLNESS:  Jeff Morrow is a 70 year old Caucasian  gentleman with a past medical history significant for hypertension and  gastroesophageal reflux disease who sustained a comminuted, impacted,  angulated pilon fracture and comminuted distal fibular fracture with  possible entrapment of the tibialis posterior tendon yesterday, 06/11  after falling from a 6 feet ladder.  He was admitted to the orthopedic  service under the care of Dr. Lequita Halt and his leg was casted.  He was  being considered for surgery by Dr. Carola Frost on Monday.  This morning he  had some dyspepsia and had an EKG checked which revealed normal sinus  rhythm without any ST-T wave changes.  He was given a proton pump  inhibitor with apparent relief of symptoms.  He had been placed on a  Dilaudid PCA and was getting Dilaudid by PCA.  He also had Xanax written  for as needed for anxiety, although I am not sure if he got any Xanax.  This afternoon, he got one dose of Phenergan.  Thereafter, he had been  sleeping from about 4 o'clock on.  His wife woke him up shortly at  around 7 o'clock and was able to feed him some food but he promptly fell  back asleep.  I came see the patient tonight at around 8:45 after I had  been consulted earlier for a possible kidney stone.  I was completely  unaware of the patient's recent history of being somnolent.  When I  entered the room, the patient was sleeping and snoring.  His lips were  cyanotic.  He was minimally responsive to deep  sternal rub, briefly  opening eyes which revealed pinpoint pupils and then going back to  sleep.  He was not withdrawing to pain with pushing on his nailbeds  intensely.  Rapid Response was called.  We put him on full face mask  oxygen and gave him three doses of Narcan  with improvement in his  saturations up to 95% on full face mask.  A blood gas was obtained which  showed a pH 7.32, pCO2 of 50, pO2 of 85.  We are in the process of  placing a second IV, giving him additional Narcan, checking a CBC,  metabolic panel, LFTs, cardiac enzymes, blood cultures (the patient was  febrile 103).  We are also getting a STAT portable chest x-ray and KUB.   PAST MEDICAL HISTORY:  1. Fracture as described above.  2. Hypertension.  3. Obesity.  4. Acid reflux.  5. Prior kidney stones, apparently with a stone in the ureter which he  tried to strain.   PAST SURGICAL HISTORY:  No recent surgery.   ALLERGIES:  Codeine.   CURRENT MEDICATIONS IN THE HOSPITAL:  Lovenox, Dilaudid, nifedipine,  pantoprazole, Phenergan, Xanax, Benadryl, LR, Robaxin, Zofran, and  Phenergan.   PHYSICAL EXAMINATION:  Blood pressure was 139/70, pulse of 110, pulse ox  was 25% initially before being put on full face mask, up to 95 5% with  full face mask.  The patient is somnolent, opening eyes to deep sternal rub.  He did wake  up with a Foley catheter was placed but then felt promptly back to  sleep.  HEENT:  He has a thick neck.  His pupils were constricted.  CARDIOVASCULAR:  Regular rate and rhythm.  No murmurs, gallops or rubs.  LUNGS:  Clear to auscultation anteriorly and posteriorly with slight  diminished breath sounds at bases.  ABDOMEN:  Protuberant with diminished bowel sounds.  No tenderness or  rebound observed.  His right ankle is wrapped in a cast.   LABORATORY DATA:  Blood gas as described above and the computer reading  is actually temperature 102.9, pH 7.28, pCO2 of 56.2, and pO2 of 99.1,  sat  96.2.  PT 13.  BMP yesterday:  Sodium 144, potassium 4.7, chloride  109, bicarb 25, glucose 144, BUN and creatinine 26 and 1.41.  CBC with  differential:  White count 15.5, hemoglobin 14.7, platelets 284, ANC  12.4.   IMPRESSION AND RECOMMENDATIONS:  This is a 70 year old obese gentleman  who sustained a fracture after falling off a 6 feet ladder with a  comminuted, impacted, and angulated pilon fracture and comminuted distal  fibular fracture with possible entrapment of the tibialis posterior  tendon who developed some blood from his penis earlier today and some  dyspepsia.  We were consulted to evaluate him for kidney stones.  He was  found be somnolent and retaining CO2 after having been on Dilaudid and  having received a dose of Phenergan.  1. Respiratory failure:  Due to narcotic excess, the patient also is      retaining CO2.  He likely aspirated his food tonight as well.  I am      going to continue to give him Narcan doses.  I am getting blood      cultures, CBC, CMP, cardiac enzymes, and I am going to put him on      Zosyn 3.275 grams and dose per pharmacy to treat for aspiration      pneumonia and I am getting a portable chest x-ray right now.  I am      consulting critical care medicine.The patient may need intubation.  2. History of dyspepsia chest pain.  I am checking a 12-lead EKG.  I      am check STAT cardiac enzymes and cycling the enzymes.  3. Acid reflux.  Continue on PPI.  4. Fever and leukocytosis.  Initial leukocytosis was thought due to      his fracture  and this is certainly likely but I think he has      probably also now aspirated.  Again, I will put him on Zosyn for      this after getting blood cultures.  Should his vitals deteriorate,      I would also give him vancomycin for possible blood stream      infection with MRSA or surgical site infection with MRSA.  5. Prophylaxis.  The patient has been on Lovenox.  6. Pain.  I am stopping  his Dilaudid and I may  put him on a Narcan      drip actually trying to reverse him and I will consider giving      flumazenil. When he comes to I would use a less potent narcotic and      less sedating antiemetic   Thank you for this consultation.      Acey Lav, MD  Electronically Signed     CV/MEDQ  D:  10/25/2008  T:  10/25/2008  Job:  [id number]   cc:   Ollen Gross, M.D.

## 2010-10-01 NOTE — Discharge Summary (Signed)
NAME:  Jeff, Morrow NO.:  [id number]   MEDICAL RECORD NO.:  [id number]          PATIENT TYPE:  INP   LOCATION:  2009                         FACILITY:  [facility]   PHYSICIAN:  Doralee Albino. Carola Frost, M.D. DATE OF BIRTH:  1940-07-29   DATE OF ADMISSION:  10/24/2008  DATE OF DISCHARGE:  11/04/2008                               DISCHARGE SUMMARY   DISCHARGE DIAGNOSES:  1. Right displaced pilon fracture, tibia and fibula.  2. Postoperative ileus, resolved.  3. Chronic hypoxic respiratory failure per Medicine, resolved.   ADDITIONAL DISCHARGE DIAGNOSES:  1. Gastroesophageal reflux disease.  2. Hypertension.  3. Benign prostatic hypertrophy.  4. History of kidney stones.  5. Obesity.   PROCEDURES PERFORMED:  1. On October 28, 2008, limited ORIF, right tibial pilon fracture, tibia      and fibula.  2. Application of external fixator, right leg.   BRIEF HISTORY AND HOSPITAL COURSE:  Jeff Morrow is a very pleasant 67-year-  old Caucasian male, who presented to the [facility] on October 24, 2008, after he sustained a fall from ladder with a resultant injury to  his right distal tibia and fibula.  The patient had an immediate onset  of pain and deformity and was brought to [facility] for  evaluation.  The patient was evaluated by Dr. Ollen Gross of  Orthopedics, who diagnosed severe comminuted fracture of the right  distal tibia and placed Jeff Morrow in a well-molded posterior stirrup  splint.  Given the severity of the injury, he consulted Dr. Carola Frost in the  Orthopedic Trauma Service for further management.  Jeff Morrow was  admitted on October 24, 2008, and Orthopedic Trauma was consulted on October 27, 2008.  The patient's hospital stay was complicated with some issues  which included some acute respiratory failure.  On the night of October 26, 2008, the patient was transferred to 2300 for closer observation.  The  patient was seen by Critical Care Medicine and  Internal Medicine  following this issue and they continued to follow along throughout his  hospital stay as well.  After this episode, Jeff Morrow was brought to the  operating room once he was cleared for surgery from the Medicine  Service.  We did not have any acute issues regarding limited operative  internal fixation of the tibial plafond as well as application of the  external fixator.  On postoperative day #1, the patient was doing very  well and was deemed stable and was then transferred to a general  medicine surgery floor which he tolerated very well.  Again, his  physical exam was unremarkable and he was tolerating the fixator well.  On postoperative day #2, the patient continued to do well, did complain  of some constipation with a small bowel movement on postoperative day  #1.  Pain was controlled.  No numbness or tingling reporting.  No  additional concerns were reported as well.  Vital signs and labs were  unremarkable.  Physical exam was unremarkable as well.  His pin sites  were dressed and looked  excellent.  Incisions were clean, dry, and  intact.  No additional findings were noted.  Distal motor and sensory  functions were intact as well.  The patient was also encouraged to  continue to work with Physical Therapy.  However, he was somewhat  hesitant to do so.  With regards to his acute respiratory failure, he  did have continued desaturations into the low 90s.  We did proceed with  requiring a CT angiogram to evaluate for the possibility of a DVT.  However, after surgery, the patient was started on Lovenox for DVT  prophylaxis which he tolerated very well.  With regards to constipation  and abdominal distention, the patient also had abdominal series  performed given constipation and some abdominal distention.  However, no  obvious changes in bowel sounds were noted.  We did begin to limit his  narcotics as well to limit the evolution of possible postoperative  ileus.  [name] did continue to progress fairly well.  On postop day  #3, he continued to do well when he had improved bowel sounds despite  abdominal x-rays demonstrating a diffuse pattern suggestive of ileus and  we did follow this serially with repeat abdominal films, which did  demonstrate some resolution over the next ensuing days.  Ultimately, CT  angiogram was negative for a PE with very small bilateral pleural  effusion and atelectasis.  Ultimately on postop day #5, the patient was  continuing to do well.  Pain was very well controlled.  At that point,  he did decide that he would like a SNF or inpatient rehabilitation as  oppose to going home with home health as he did realize it would be  fairly difficult for his family if they care for him in his current  state.  As such, we did obtain an inpatient rehabilitation consult, who  kindly accepted the patient to their service.  On November 04, 2008, the  patient was deemed stable from an acute care standpoint and was then  transported down to inpatient rehabilitation unit for further  rehabilitation from his injury.   CLINICAL ENCOUNTER NOTE FOR POSTOPERATIVE DAY #6:  SUBJECTIVE/OBJECTIVE:  The patient doing great, no complaints.  Positive bowel movement  yesterday, tolerating diet.  Pain is well controlled, ambulated to the  hall with Physical Therapy yesterday and tolerated this well.  VITAL SIGNS:  Temperature 97.8, heart rate 111, respirations 20, 98% on  room air, BP of 106/63.  GENERAL:  No acute distress, comfortable.  LUNGS:  Clear.  CARDIAC:  S1 and S2.  ABDOMEN:  Improved bowel sounds.  EXTREMITIES:  Lower extremity incisions look outstanding, no drainage.  Edema is controlled.  EHL, FHL motor intact.  Deep peroneal nerve,  superficial peroneal nerve, tibial nerve, sensory functions are intact.  No deep calf tenderness noted.  Extremities are warm.  Palpable dorsalis  pedis pulse.   INR is 2.1.  Sodium 139, potassium 4.1, chloride  109, bicarb 23, BUN 17,  creatinine 1.07, glucose 105.  White blood cells 9.1, hemoglobin 12.7,  hematocrit 37.8, platelets 436.   ASSESSMENT AND PLAN:  A 70 year old male status post external fixation  of right pilon fracture with limited internal fixation, continues to  improve.  1. External fixation right pilon, nonweightbearing right lower      extremity with Physical Therapy/Occupational Therapy.  Continue      with digit range of motion.  Elevate heels off bed when lying down.      Dressing was  changed.  2. Postoperative ileus, resolving.  Continue to monitor, clinically      better.  3. Chronic hypoxic respiratory failure per Medicine, but improving.  4. Hypertension and gastroesophageal reflux disease, stable.  5. Pain control.  No change in pain management.  Continue with oral      medication.  6. Deep venous thrombosis prophylaxis, Coumadin.  His INR is      therapeutic today.  7. Disposition to inpatient rehabilitation today.   DISCHARGE MEDICATIONS:  1. Procardia 90 mg p.o. daily.  2. Prilosec 20 mg p.o. daily.  3. Lasix 20 mg p.o. daily.  4. Flomax 0.5 mg p.o. daily.  5. Coumadin per pharmacy protocol.  6. Norco 5/325 as needed for pain q.4-6 h.   DISCHARGE INSTRUCTIONS AND PLAN:  At this point, Jeff Morrow will be  discharged to inpatient rehabilitation to continue to recover from his  current injury.  He will continue to be nonweightbearing on his right  lower extremity for the next 6-8 weeks.  At that time, we do anticipate  removal of the fixator on the 6-8-week mark with initiation of protected  graduated weightbearing in a Cam boot.  Upon removal of his ex fix, we  would encourage range of motion of his ankle.  While the external  fixator is on, daily dressing changes should be performed and daily pin  site care should be performed with soap and water to the pin sites and  wrap with Kerlix to tamp the bone pin interface.  We will continue to  follow Jeff Morrow  during his stay in the inpatient rehabilitation unit,  periodically checking in to evaluate his progress.  We will remove the  sutures at 14 days after surgery as well.  Jeff Morrow will continue to be  on Coumadin for DVT prophylaxis given his limited mobility.  He was  covered successfully with Lovenox while his INR became therapeutic and  tolerated this very well.  Upon discharge from the inpatient  rehabilitation unit, Jeff Morrow will follow up in the office in a week to  10 days for evaluation as well as planning for removal of the external  fixator.  Should the patient have any questions or if any other  providers have any questions during any point, they are to feel free to  contact the office at [phone].      Mearl Latin, PA      Doralee Albino. Carola Frost, M.D.  Electronically Signed    KWP/MEDQ  D:  12/17/2008  T:  12/18/2008  Job:  [id number]

## 2019-10-08 DIAGNOSIS — I4891 Unspecified atrial fibrillation: Secondary | ICD-10-CM | POA: Insufficient documentation

## 2019-10-08 DIAGNOSIS — G629 Polyneuropathy, unspecified: Secondary | ICD-10-CM | POA: Insufficient documentation

## 2019-10-08 DIAGNOSIS — I10 Essential (primary) hypertension: Secondary | ICD-10-CM | POA: Insufficient documentation

## 2019-10-08 DIAGNOSIS — E785 Hyperlipidemia, unspecified: Secondary | ICD-10-CM | POA: Insufficient documentation

## 2019-10-08 DIAGNOSIS — J9601 Acute respiratory failure with hypoxia: Secondary | ICD-10-CM | POA: Insufficient documentation

## 2019-10-08 DIAGNOSIS — I2510 Atherosclerotic heart disease of native coronary artery without angina pectoris: Secondary | ICD-10-CM | POA: Insufficient documentation

## 2019-10-09 DIAGNOSIS — I5030 Unspecified diastolic (congestive) heart failure: Secondary | ICD-10-CM | POA: Insufficient documentation

## 2019-11-11 ENCOUNTER — Ambulatory Visit (INDEPENDENT_AMBULATORY_CARE_PROVIDER_SITE_OTHER): Payer: Medicare PPO

## 2019-11-11 ENCOUNTER — Encounter: Payer: Self-pay | Admitting: Podiatry

## 2019-11-11 ENCOUNTER — Other Ambulatory Visit: Payer: Self-pay

## 2019-11-11 ENCOUNTER — Ambulatory Visit: Payer: Medicare PPO | Admitting: Podiatry

## 2019-11-11 DIAGNOSIS — M7752 Other enthesopathy of left foot: Secondary | ICD-10-CM | POA: Diagnosis not present

## 2019-11-11 DIAGNOSIS — R600 Localized edema: Secondary | ICD-10-CM

## 2019-11-11 DIAGNOSIS — M7751 Other enthesopathy of right foot: Secondary | ICD-10-CM

## 2019-11-11 DIAGNOSIS — M779 Enthesopathy, unspecified: Secondary | ICD-10-CM | POA: Diagnosis not present

## 2019-11-11 DIAGNOSIS — R52 Pain, unspecified: Secondary | ICD-10-CM | POA: Diagnosis not present

## 2019-11-11 DIAGNOSIS — L97401 Non-pressure chronic ulcer of unspecified heel and midfoot limited to breakdown of skin: Secondary | ICD-10-CM

## 2019-11-12 ENCOUNTER — Other Ambulatory Visit: Payer: Self-pay | Admitting: Podiatry

## 2019-11-12 DIAGNOSIS — M779 Enthesopathy, unspecified: Secondary | ICD-10-CM

## 2019-11-12 DIAGNOSIS — L97401 Non-pressure chronic ulcer of unspecified heel and midfoot limited to breakdown of skin: Secondary | ICD-10-CM

## 2019-11-12 DIAGNOSIS — R600 Localized edema: Secondary | ICD-10-CM

## 2019-11-13 NOTE — Progress Notes (Signed)
Subjective:   Patient ID: Jeff Morrow, male   DOB: 79 y.o.   MRN: [id number]   HPI Patient presents stating is a chronic pain in his both his feet he seen a number of physicians has pain in his ankles of both feet and forefoot and states that they worked on the forefoot which helps him only temporarily and he feels like he is walking on his bones.  Patient does not smoke likes to be active   Review of Systems  All other systems reviewed and are negative.       Objective:  Physical Exam Vitals and nursing note reviewed.  Constitutional:      Appearance: He is well-developed.  Pulmonary:     Effort: Pulmonary effort is normal.  Musculoskeletal:        General: Normal range of motion.  Skin:    General: Skin is warm.  Neurological:     Mental Status: He is alert.     Neurovascular status found to be intact muscle strength to be adequate range of motion within normal limits.  Patient is found to have quite a bit of inflammation of the sinus tarsi bilateral with fluid buildup around the sinus tarsi bilateral.  Patient is found to have good digital perfusion is well oriented x3     Assessment:  Acute sinus tarsitis bilateral forefoot inflammation bilateral with prominent bone structure     Plan:  H&P conditions reviewed at this point I discussed we are not can ever be able to make his pain go away but I will do what I can to try to help him.  I did do sterile prep I injected the sinus tarsi bilateral 3 mg Kenalog 5 mg Xylocaine and then I scheduled him to have orthotics made by ped orthotist and I want to make very soft to try to take pressure off the underlying metatarsals.  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

## 2023-01-15 DEATH — deceased
